# Patient Record
Sex: Male | Born: 1990 | Race: Black or African American | Hispanic: No | Marital: Single | Smoking: Current every day smoker
Health system: Southern US, Community
[De-identification: ages and names within clinical notes are randomized; demographics above are authoritative.]

---

## 2019-09-18 ENCOUNTER — Encounter (HOSPITAL_COMMUNITY): Payer: Self-pay | Admitting: Emergency Medicine

## 2019-09-18 ENCOUNTER — Ambulatory Visit (HOSPITAL_COMMUNITY)
Admission: EM | Admit: 2019-09-18 | Discharge: 2019-09-18 | Disposition: A | Discharge status: Home / Self Care | Payer: Self-pay | Attending: Family Medicine | Admitting: Family Medicine

## 2019-09-18 ENCOUNTER — Other Ambulatory Visit: Payer: Self-pay

## 2019-09-18 DIAGNOSIS — Z113 Encounter for screening for infections with a predominantly sexual mode of transmission: Secondary | ICD-10-CM

## 2019-09-18 NOTE — ED Triage Notes (Signed)
STI screening, no symptoms.

## 2019-09-18 NOTE — ED Provider Notes (Signed)
Patient presented with significant other for STD screening, denying symptoms or any known exposures.  When discussing urethral swab for screening for gonorrhea, chlamydia and trichomonas, patient opted to decline this and no longer wished to be seen.   Lew Dawes, [place] 09/18/19 1814
